# Patient Record
Sex: Female | Born: 1965 | Race: White | Hispanic: No | Marital: Single | Smoking: Never smoker
Health system: Southern US, Community
[De-identification: ages and names within clinical notes are randomized; demographics above are authoritative.]

## PROBLEM LIST (undated history)

## (undated) DIAGNOSIS — D649 Anemia, unspecified: Secondary | ICD-10-CM

## (undated) HISTORY — DX: Anemia, unspecified: D64.9

## (undated) HISTORY — PX: REFRACTIVE SURGERY: SHX103

---

## 2018-03-01 ENCOUNTER — Ambulatory Visit (INDEPENDENT_AMBULATORY_CARE_PROVIDER_SITE_OTHER): Payer: PRIVATE HEALTH INSURANCE | Admitting: Family Medicine

## 2018-03-01 ENCOUNTER — Encounter: Payer: Self-pay | Admitting: Family Medicine

## 2018-03-01 VITALS — BP 118/70 | HR 62 | Ht 68.25 in | Wt 160.5 lb

## 2018-03-01 DIAGNOSIS — Z0000 Encounter for general adult medical examination without abnormal findings: Secondary | ICD-10-CM

## 2018-03-01 DIAGNOSIS — Z1211 Encounter for screening for malignant neoplasm of colon: Secondary | ICD-10-CM | POA: Insufficient documentation

## 2018-03-01 LAB — URINALYSIS, ROUTINE W REFLEX MICROSCOPIC
Bilirubin Urine: NEGATIVE
Hgb urine dipstick: NEGATIVE
Ketones, ur: NEGATIVE
Leukocytes, UA: NEGATIVE
Nitrite: NEGATIVE
PH: 6.5 (ref 5.0–8.0)
RBC / HPF: NONE SEEN (ref 0–?)
Specific Gravity, Urine: 1.025 (ref 1.000–1.030)
Total Protein, Urine: NEGATIVE
Urine Glucose: NEGATIVE
Urobilinogen, UA: 0.2 (ref 0.0–1.0)

## 2018-03-01 LAB — COMPREHENSIVE METABOLIC PANEL
ALT: 16 U/L (ref 0–35)
AST: 18 U/L (ref 0–37)
Albumin: 4.2 g/dL (ref 3.5–5.2)
Alkaline Phosphatase: 61 U/L (ref 39–117)
BUN: 14 mg/dL (ref 6–23)
CHLORIDE: 105 meq/L (ref 96–112)
CO2: 30 mEq/L (ref 19–32)
CREATININE: 0.83 mg/dL (ref 0.40–1.20)
Calcium: 9.5 mg/dL (ref 8.4–10.5)
GFR: 76.55 mL/min (ref >60.00)
GLUCOSE: 97 mg/dL (ref 70–99)
Potassium: 4 mEq/L (ref 3.5–5.1)
SODIUM: 142 meq/L (ref 135–145)
TOTAL PROTEIN: 6.6 g/dL (ref 6.0–8.3)
Total Bilirubin: 0.8 mg/dL (ref 0.2–1.2)

## 2018-03-01 LAB — CBC
HCT: 39.8 % (ref 36.0–46.0)
Hemoglobin: 13.1 g/dL (ref 12.0–15.0)
MCHC: 32.9 g/dL (ref 30.0–36.0)
MCV: 94.8 fl (ref 78.0–100.0)
Platelets: 191 10*3/uL (ref 150.0–400.0)
RBC: 4.2 Mil/uL (ref 3.87–5.11)
RDW: 12.8 % (ref 11.5–15.5)
WBC: 4.7 10*3/uL (ref 4.0–10.5)

## 2018-03-01 LAB — LIPID PANEL
Cholesterol: 224 mg/dL — ABNORMAL HIGH (ref 0–200)
HDL: 82.3 mg/dL (ref >39.00)
LDL CALC: 125 mg/dL — AB (ref 0–99)
NONHDL: 141.24
Total CHOL/HDL Ratio: 3
Triglycerides: 82 mg/dL (ref 0.0–149.0)
VLDL: 16.4 mg/dL (ref 0.0–40.0)

## 2018-03-01 LAB — TSH: TSH: 1.71 u[IU]/mL (ref 0.35–4.50)

## 2018-03-01 NOTE — Progress Notes (Signed)
Subjective:  Patient ID: Elizabeth Chung, female    DOB: 07/20/1966  Age: 52 y.o. MRN: [id number]  CC: Establish Care   HPI Abriana Saltos presents for a physical exam.  She recently moved to this area from [facility] for her job.  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  She practices yoga and her Fitbit tracks her steps.  She had a goal of 10,000 steps daily.  She has no chronic illnesses or health concerns that she is aware of.  She is single and has no children.  Her mom passed at age 14 from an MI.  Mom had hypertension.  Dad passed at age 72 from Alzheimer's.  Patient has 2 sisters with thyroid disease.  Patient is never had a colonoscopy.  She is due for Pap smear.  She plans follow-up with her GYN provider.  She is fasting today.  She does not smoke or use illicit drugs.  She rarely drinks alcohol.    History Tiya has no past medical history on file.   She has no past surgical history on file.   Her family history is not on file.She reports that she has never smoked. She has never used smokeless tobacco. Her alcohol and drug histories are not on file.  No outpatient medications prior to visit.   No facility-administered medications prior to visit.     ROS Review of Systems  Constitutional: Negative for chills, fatigue, fever and unexpected weight change.  HENT: Positive for congestion.   Eyes: Negative for photophobia and visual disturbance.  Respiratory: Negative for shortness of breath.   Cardiovascular: Negative.   Gastrointestinal: Negative.   Endocrine: Negative for cold intolerance and heat intolerance.  Genitourinary: Negative.   Musculoskeletal: Negative for gait problem and joint swelling.  Skin: Negative for pallor and rash.  Allergic/Immunologic: Negative for immunocompromised state.  Neurological: Negative for weakness and headaches.  Hematological: Does not bruise/bleed easily.  Psychiatric/Behavioral: Negative.     Objective:  BP 118/70 (BP Location: Left  Arm, Patient Position: Sitting, Cuff Size: Normal)   Pulse 62   Ht 5' 8.25" (1.734 m)   Wt 160 lb 8 oz (72.8 kg)   SpO2 99%   BMI 24.23 kg/m   Physical Exam  Constitutional: She is oriented to person, place, and time. She appears well-developed and well-nourished. No distress.  HENT:  Head: Normocephalic and atraumatic.  Right Ear: External ear normal.  Left Ear: External ear normal.  Mouth/Throat: Oropharynx is clear and moist. No oropharyngeal exudate.  Eyes: Pupils are equal, round, and reactive to light. Conjunctivae are normal. Right eye exhibits no discharge. Left eye exhibits no discharge. No scleral icterus.  Neck: Neck supple. No JVD present. No tracheal deviation present. No thyromegaly present.  Cardiovascular: Normal rate, regular rhythm and normal heart sounds.  Pulmonary/Chest: Effort normal and breath sounds normal. No stridor.  Abdominal: Soft. Bowel sounds are normal. She exhibits no distension. There is no tenderness. There is no rebound and no guarding.  Lymphadenopathy:    She has no cervical adenopathy.  Neurological: She is alert and oriented to person, place, and time.  Skin: Skin is warm and dry. She is not diaphoretic.  Psychiatric: She has a normal mood and affect. Her behavior is normal.      Assessment & Plan:   Noella was seen today for establish care.  Diagnoses and all orders for this visit:  Health care maintenance -     CBC -     Comprehensive  metabolic panel -     HIV antibody -     Lipid panel -     TSH -     Urinalysis, Routine w reflex microscopic  Screen for colon cancer -     Ambulatory referral to Gastroenterology   Roxana HiresMary Hoaglin does not currently have medications on file.  No orders of the defined types were placed in this encounter.    Follow-up: Return Recommended follow up pends results of lab studies.  Mliss SaxWilliam Alfred Fancy Dunkley, MD

## 2018-03-01 NOTE — Patient Instructions (Signed)
Colonoscopy, Adult A colonoscopy is an exam to look at the entire large intestine. During the exam, a lubricated, bendable tube is inserted into the anus and then passed into the rectum, colon, and other parts of the large intestine. A colonoscopy is often done as a part of normal colorectal screening or in response to certain symptoms, such as anemia, persistent diarrhea, abdominal pain, and blood in the stool. The exam can help screen for and diagnose medical problems, including:  Tumors.  Polyps.  Inflammation.  Areas of bleeding.  Tell a health care provider about:  Any allergies you have.  All medicines you are taking, including vitamins, herbs, eye drops, creams, and over-the-counter medicines.  Any problems you or family members have had with anesthetic medicines.  Any blood disorders you have.  Any surgeries you have had.  Any medical conditions you have.  Any problems you have had passing stool. What are the risks? Generally, this is a safe procedure. However, problems may occur, including:  Bleeding.  A tear in the intestine.  A reaction to medicines given during the exam.  Infection (rare).  What happens before the procedure? Eating and drinking restrictions Follow instructions from your health care provider about eating and drinking, which may include:  A few days before the procedure - follow a low-fiber diet. Avoid nuts, seeds, dried fruit, raw fruits, and vegetables.  1-3 days before the procedure - follow a clear liquid diet. Drink only clear liquids, such as clear broth or bouillon, black coffee or tea, clear juice, clear soft drinks or sports drinks, gelatin dessert, and popsicles. Avoid any liquids that contain red or purple dye.  On the day of the procedure - do not eat or drink anything during the 2 hours before the procedure, or within the time period that your health care provider recommends.  Bowel prep If you were prescribed an oral bowel prep  to clean out your colon:  Take it as told by your health care provider. Starting the day before your procedure, you will need to drink a large amount of medicated liquid. The liquid will cause you to have multiple loose stools until your stool is almost clear or light green.  If your skin or anus gets irritated from diarrhea, you may use these to relieve the irritation: ? Medicated wipes, such as adult wet wipes with aloe and vitamin E. ? A skin soothing-product like petroleum jelly.  If you vomit while drinking the bowel prep, take a break for up to 60 minutes and then begin the bowel prep again. If vomiting continues and you cannot take the bowel prep without vomiting, call your health care provider.  General instructions  Ask your health care provider about changing or stopping your regular medicines. This is especially important if you are taking diabetes medicines or blood thinners.  Plan to have someone take you home from the hospital or clinic. What happens during the procedure?  An IV tube may be inserted into one of your veins.  You will be given medicine to help you relax (sedative).  To reduce your risk of infection: ? Your health care team will wash or sanitize their hands. ? Your anal area will be washed with soap.  You will be asked to lie on your side with your knees bent.  Your health care provider will lubricate a long, thin, flexible tube. The tube will have a camera and a light on the end.  The tube will be inserted into your  anus.  The tube will be gently eased through your rectum and colon.  Air will be delivered into your colon to keep it open. You may feel some pressure or cramping.  The camera will be used to take images during the procedure.  A small tissue sample may be removed from your body to be examined under a microscope (biopsy). If any potential problems are found, the tissue will be sent to a lab for testing.  If small polyps are found, your  health care provider may remove them and have them checked for cancer cells.  The tube that was inserted into your anus will be slowly removed. The procedure may vary among health care providers and hospitals. What happens after the procedure?  Your blood pressure, heart rate, breathing rate, and blood oxygen level will be monitored until the medicines you were given have worn off.  Do not drive for 24 hours after the exam.  You may have a small amount of blood in your stool.  You may pass gas and have mild abdominal cramping or bloating due to the air that was used to inflate your colon during the exam.  It is up to you to get the results of your procedure. Ask your health care provider, or the department performing the procedure, when your results will be ready. This information is not intended to replace advice given to you by your health care provider. Make sure you discuss any questions you have with your health care provider. Document Released: 11/20/2000 Document Revised: 09/23/2016 Document Reviewed: 02/04/2016 Elsevier Interactive Patient Education  2018 [place] Screening Colorectal cancer screening is a group of tests used to check for colorectal cancer. Colorectal refers to your colon and rectum. Your colon and rectum are located at the end of your large intestine and carry your bowel movements out of your body. Why is colorectal cancer screening done? It is common for abnormal growths (polyps) to form in the lining of your colon, especially as you get older. These polyps can be cancerous or become cancerous. If colorectal cancer is found at an early stage, it is treatable. Who should be screened for colorectal cancer? Screening is recommended for all adults at average risk starting at age 60. Tests may be recommended every 1 to 10 years. Your health care provider may recommend earlier or more frequent screening if you have:  A history of colorectal  cancer or polyps.  A family member with a history of colorectal cancer or polyps.  Inflammatory bowel disease, such as ulcerative colitis or Crohn disease.  A type of hereditary colon cancer syndrome.  Colorectal cancer symptoms.  Types of screening tests There are several types of colorectal screening tests. They include:  Guaiac-based fecal occult blood testing.  Fecal immunochemical test (FIT).  Stool DNA test.  Barium enema.  Virtual colonoscopy.  Sigmoidoscopy. During this test, a sigmoidoscope is used to examine your rectum and lower colon. A sigmoidoscope is a flexible tube with a camera that is inserted through your anus into your rectum and lower colon.  Colonoscopy. During this test, a colonoscope is used to examine your entire colon. A colonoscope is a long, thin, flexible tube with a camera. This test examines your entire colon and rectum.  This information is not intended to replace advice given to you by your health care provider. Make sure you discuss any questions you have with your health care provider. Document Released: 05/13/2010 Document Revised: 07/02/2016 Document Reviewed: 03/01/2014  Chartered certified accountant Patient Education  2018 [place] Maintenance, Female Adopting a healthy lifestyle and getting preventive care can go a long way to promote health and wellness. Talk with your health care provider about what schedule of regular examinations is right for you. This is a good chance for you to check in with your provider about disease prevention and staying healthy. In between checkups, there are plenty of things you can do on your own. Experts have done a lot of research about which lifestyle changes and preventive measures are most likely to keep you healthy. Ask your health care provider for more information. Weight and diet Eat a healthy diet  Be sure to include plenty of vegetables, fruits, low-fat dairy products, and lean protein.  Do not  eat a lot of foods high in solid fats, added sugars, or salt.  Get regular exercise. This is one of the most important things you can do for your health. ? Most adults should exercise for at least 150 minutes each week. The exercise should increase your heart rate and make you sweat (moderate-intensity exercise). ? Most adults should also do strengthening exercises at least twice a week. This is in addition to the moderate-intensity exercise.  Maintain a healthy weight  Body mass index (BMI) is a measurement that can be used to identify possible weight problems. It estimates body fat based on height and weight. Your health care provider can help determine your BMI and help you achieve or maintain a healthy weight.  For females 43 years of age and older: ? A BMI below 18.5 is considered underweight. ? A BMI of 18.5 to 24.9 is normal. ? A BMI of 25 to 29.9 is considered overweight. ? A BMI of 30 and above is considered obese.  Watch levels of cholesterol and blood lipids  You should start having your blood tested for lipids and cholesterol at 53 years of age, then have this test every 5 years.  You may need to have your cholesterol levels checked more often if: ? Your lipid or cholesterol levels are high. ? You are older than 53 years of age. ? You are at high risk for heart disease.  Cancer screening Lung Cancer  Lung cancer screening is recommended for adults 62-71 years old who are at high risk for lung cancer because of a history of smoking.  A yearly low-dose CT scan of the lungs is recommended for people who: ? Currently smoke. ? Have quit within the past 15 years. ? Have at least a 30-pack-year history of smoking. A pack year is smoking an average of one pack of cigarettes a day for 1 year.  Yearly screening should continue until it has been 15 years since you quit.  Yearly screening should stop if you develop a health problem that would prevent you from having lung cancer  treatment.  Breast Cancer  Practice breast self-awareness. This means understanding how your breasts normally appear and feel.  It also means doing regular breast self-exams. Let your health care provider know about any changes, no matter how small.  If you are in your 20s or 30s, you should have a clinical breast exam (CBE) by a health care provider every 1-3 years as part of a regular health exam.  If you are 67 or older, have a CBE every year. Also consider having a breast X-ray (mammogram) every year.  If you have a family history of breast cancer, talk to your health care provider about  genetic screening.  If you are at high risk for breast cancer, talk to your health care provider about having an MRI and a mammogram every year.  Breast cancer gene (BRCA) assessment is recommended for women who have family members with BRCA-related cancers. BRCA-related cancers include: ? Breast. ? Ovarian. ? Tubal. ? Peritoneal cancers.  Results of the assessment will determine the need for genetic counseling and BRCA1 and BRCA2 testing.  Cervical Cancer Your health care provider may recommend that you be screened regularly for cancer of the pelvic organs (ovaries, uterus, and vagina). This screening involves a pelvic examination, including checking for microscopic changes to the surface of your cervix (Pap test). You may be encouraged to have this screening done every 3 years, beginning at age 30.  For women ages 59-65, health care providers may recommend pelvic exams and Pap testing every 3 years, or they may recommend the Pap and pelvic exam, combined with testing for human papilloma virus (HPV), every 5 years. Some types of HPV increase your risk of cervical cancer. Testing for HPV may also be done on women of any age with unclear Pap test results.  Other health care providers may not recommend any screening for nonpregnant women who are considered low risk for pelvic cancer and who do not have  symptoms. Ask your health care provider if a screening pelvic exam is right for you.  If you have had past treatment for cervical cancer or a condition that could lead to cancer, you need Pap tests and screening for cancer for at least 20 years after your treatment. If Pap tests have been discontinued, your risk factors (such as having a new sexual partner) need to be reassessed to determine if screening should resume. Some women have medical problems that increase the chance of getting cervical cancer. In these cases, your health care provider may recommend more frequent screening and Pap tests.  Colorectal Cancer  This type of cancer can be detected and often prevented.  Routine colorectal cancer screening usually begins at 53 years of age and continues through 53 years of age.  Your health care provider may recommend screening at an earlier age if you have risk factors for colon cancer.  Your health care provider may also recommend using home test kits to check for hidden blood in the stool.  A small camera at the end of a tube can be used to examine your colon directly (sigmoidoscopy or colonoscopy). This is done to check for the earliest forms of colorectal cancer.  Routine screening usually begins at age 89.  Direct examination of the colon should be repeated every 5-10 years through 53 years of age. However, you may need to be screened more often if early forms of precancerous polyps or small growths are found.  Skin Cancer  Check your skin from head to toe regularly.  Tell your health care provider about any new moles or changes in moles, especially if there is a change in a mole's shape or color.  Also tell your health care provider if you have a mole that is larger than the size of a pencil eraser.  Always use sunscreen. Apply sunscreen liberally and repeatedly throughout the day.  Protect yourself by wearing long sleeves, pants, a wide-brimmed hat, and sunglasses whenever you  are outside.  Heart disease, diabetes, and high blood pressure  High blood pressure causes heart disease and increases the risk of stroke. High blood pressure is more likely to develop in: ? People  who have blood pressure in the high end of the normal range (130-139/85-89 mm Hg). ? People who are overweight or obese. ? People who are African American.  If you are 45-21 years of age, have your blood pressure checked every 3-5 years. If you are 23 years of age or older, have your blood pressure checked every year. You should have your blood pressure measured twice-once when you are at a hospital or clinic, and once when you are not at a hospital or clinic. Record the average of the two measurements. To check your blood pressure when you are not at a hospital or clinic, you can use: ? An automated blood pressure machine at a pharmacy. ? A home blood pressure monitor.  If you are between 50 years and 68 years old, ask your health care provider if you should take aspirin to prevent strokes.  Have regular diabetes screenings. This involves taking a blood sample to check your fasting blood sugar level. ? If you are at a normal weight and have a low risk for diabetes, have this test once every three years after 53 years of age. ? If you are overweight and have a high risk for diabetes, consider being tested at a younger age or more often. Preventing infection Hepatitis B  If you have a higher risk for hepatitis B, you should be screened for this virus. You are considered at high risk for hepatitis B if: ? You were born in a country where hepatitis B is common. Ask your health care provider which countries are considered high risk. ? Your parents were born in a high-risk country, and you have not been immunized against hepatitis B (hepatitis B vaccine). ? You have HIV or AIDS. ? You use needles to inject street drugs. ? You live with someone who has hepatitis B. ? You have had sex with someone who  has hepatitis B. ? You get hemodialysis treatment. ? You take certain medicines for conditions, including cancer, organ transplantation, and autoimmune conditions.  Hepatitis C  Blood testing is recommended for: ? Everyone born from [birth year] through 1966. ? Anyone with known risk factors for hepatitis C.  Sexually transmitted infections (STIs)  You should be screened for sexually transmitted infections (STIs) including gonorrhea and chlamydia if: ? You are sexually active and are younger than 53 years of age. ? You are older than 53 years of age and your health care provider tells you that you are at risk for this type of infection. ? Your sexual activity has changed since you were last screened and you are at an increased risk for chlamydia or gonorrhea. Ask your health care provider if you are at risk.  If you do not have HIV, but are at risk, it may be recommended that you take a prescription medicine daily to prevent HIV infection. This is called pre-exposure prophylaxis (PrEP). You are considered at risk if: ? You are sexually active and do not regularly use condoms or know the HIV status of your partner(s). ? You take drugs by injection. ? You are sexually active with a partner who has HIV.  Talk with your health care provider about whether you are at high risk of being infected with HIV. If you choose to begin PrEP, you should first be tested for HIV. You should then be tested every 3 months for as long as you are taking PrEP. Pregnancy  If you are premenopausal and you may become pregnant, ask your health care provider  about preconception counseling.  If you may become pregnant, take 400 to 800 micrograms (mcg) of folic acid every day.  If you want to prevent pregnancy, talk to your health care provider about birth control (contraception). Osteoporosis and menopause  Osteoporosis is a disease in which the bones lose minerals and strength with aging. This can result in serious bone  fractures. Your risk for osteoporosis can be identified using a bone density scan.  If you are 34 years of age or older, or if you are at risk for osteoporosis and fractures, ask your health care provider if you should be screened.  Ask your health care provider whether you should take a calcium or vitamin D supplement to lower your risk for osteoporosis.  Menopause may have certain physical symptoms and risks.  Hormone replacement therapy may reduce some of these symptoms and risks. Talk to your health care provider about whether hormone replacement therapy is right for you. Follow these instructions at home:  Schedule regular health, dental, and eye exams.  Stay current with your immunizations.  Do not use any tobacco products including cigarettes, chewing tobacco, or electronic cigarettes.  If you are pregnant, do not drink alcohol.  If you are breastfeeding, limit how much and how often you drink alcohol.  Limit alcohol intake to no more than 1 drink per day for nonpregnant women. One drink equals 12 ounces of beer, 5 ounces of wine, or 1 ounces of hard liquor.  Do not use street drugs.  Do not share needles.  Ask your health care provider for help if you need support or information about quitting drugs.  Tell your health care provider if you often feel depressed.  Tell your health care provider if you have ever been abused or do not feel safe at home. This information is not intended to replace advice given to you by your health care provider. Make sure you discuss any questions you have with your health care provider. Document Released: 06/08/2011 Document Revised: 04/30/2016 Document Reviewed: 08/27/2015 Elsevier Interactive Patient Education  2018 [place] Years, Female Preventive care refers to lifestyle choices and visits with your health care provider that can promote health and wellness. What does preventive care include?  A yearly  physical exam. This is also called an annual well check.  Dental exams once or twice a year.  Routine eye exams. Ask your health care provider how often you should have your eyes checked.  Personal lifestyle choices, including: ? Daily care of your teeth and gums. ? Regular physical activity. ? Eating a healthy diet. ? Avoiding tobacco and drug use. ? Limiting alcohol use. ? Practicing safe sex. ? Taking low-dose aspirin daily starting at age 56. ? Taking vitamin and mineral supplements as recommended by your health care provider. What happens during an annual well check? The services and screenings done by your health care provider during your annual well check will depend on your age, overall health, lifestyle risk factors, and family history of disease. Counseling Your health care provider may ask you questions about your:  Alcohol use.  Tobacco use.  Drug use.  Emotional well-being.  Home and relationship well-being.  Sexual activity.  Eating habits.  Work and work Statistician.  Method of birth control.  Menstrual cycle.  Pregnancy history.  Screening You may have the following tests or measurements:  Height, weight, and BMI.  Blood pressure.  Lipid and cholesterol levels. These may be checked every 5 years, or  more frequently if you are over 7 years old.  Skin check.  Lung cancer screening. You may have this screening every year starting at age 35 if you have a 30-pack-year history of smoking and currently smoke or have quit within the past 15 years.  Fecal occult blood test (FOBT) of the stool. You may have this test every year starting at age 53.  Flexible sigmoidoscopy or colonoscopy. You may have a sigmoidoscopy every 5 years or a colonoscopy every 10 years starting at age 104.  Hepatitis C blood test.  Hepatitis B blood test.  Sexually transmitted disease (STD) testing.  Diabetes screening. This is done by checking your blood sugar (glucose)  after you have not eaten for a while (fasting). You may have this done every 1-3 years.  Mammogram. This may be done every 1-2 years. Talk to your health care provider about when you should start having regular mammograms. This may depend on whether you have a family history of breast cancer.  BRCA-related cancer screening. This may be done if you have a family history of breast, ovarian, tubal, or peritoneal cancers.  Pelvic exam and Pap test. This may be done every 3 years starting at age 82. Starting at age 30, this may be done every 5 years if you have a Pap test in combination with an HPV test.  Bone density scan. This is done to screen for osteoporosis. You may have this scan if you are at high risk for osteoporosis.  Discuss your test results, treatment options, and if necessary, the need for more tests with your health care provider. Vaccines Your health care provider may recommend certain vaccines, such as:  Influenza vaccine. This is recommended every year.  Tetanus, diphtheria, and acellular pertussis (Tdap, Td) vaccine. You may need a Td booster every 10 years.  Varicella vaccine. You may need this if you have not been vaccinated.  Zoster vaccine. You may need this after age 59.  Measles, mumps, and rubella (MMR) vaccine. You may need at least one dose of MMR if you were born in 1957 or later. You may also need a second dose.  Pneumococcal 13-valent conjugate (PCV13) vaccine. You may need this if you have certain conditions and were not previously vaccinated.  Pneumococcal polysaccharide (PPSV23) vaccine. You may need one or two doses if you smoke cigarettes or if you have certain conditions.  Meningococcal vaccine. You may need this if you have certain conditions.  Hepatitis A vaccine. You may need this if you have certain conditions or if you travel or work in places where you may be exposed to hepatitis A.  Hepatitis B vaccine. You may need this if you have certain  conditions or if you travel or work in places where you may be exposed to hepatitis B.  Haemophilus influenzae type b (Hib) vaccine. You may need this if you have certain conditions.  Talk to your health care provider about which screenings and vaccines you need and how often you need them. This information is not intended to replace advice given to you by your health care provider. Make sure you discuss any questions you have with your health care provider. Document Released: 12/20/2015 Document Revised: 08/12/2016 Document Reviewed: 09/24/2015 Elsevier Interactive Patient Education  [employer].

## 2018-03-02 LAB — HIV ANTIBODY (ROUTINE TESTING W REFLEX): HIV: NONREACTIVE

## 2018-04-01 ENCOUNTER — Encounter: Payer: Self-pay | Admitting: Family Medicine

## 2019-06-14 ENCOUNTER — Telehealth: Payer: Self-pay

## 2019-06-14 NOTE — Telephone Encounter (Signed)
Copied from [place] [phone]. Topic: Appointment Scheduling - Transfer of Care >> Jun 14, 2019 10:47 AM Alanda Slim E wrote: Pt is requesting to transfer FROM: Dr. Ethelene Hal Pt is requesting to transfer TO: [place] Reason for requested transfer: Pt has moved to a different area   Please call Pt when the transfer is approved / Cb# [phone]  Send CRM to patient's current PCP (transferring FROM).

## 2019-06-15 NOTE — Telephone Encounter (Signed)
Pt requesting to transfer care to you. Please advise. Thanks!

## 2019-06-15 NOTE — Telephone Encounter (Signed)
Okay 

## 2019-06-15 NOTE — Telephone Encounter (Signed)
Pt scheduled for 07/10/19 for TOC.

## 2019-06-15 NOTE — Telephone Encounter (Signed)
ok 

## 2019-07-10 ENCOUNTER — Other Ambulatory Visit: Payer: Self-pay

## 2019-07-10 ENCOUNTER — Encounter: Payer: Self-pay | Admitting: Family Medicine

## 2019-07-10 ENCOUNTER — Telehealth: Payer: Self-pay | Admitting: *Deleted

## 2019-07-10 ENCOUNTER — Ambulatory Visit (INDEPENDENT_AMBULATORY_CARE_PROVIDER_SITE_OTHER): Payer: PRIVATE HEALTH INSURANCE | Admitting: Family Medicine

## 2019-07-10 VITALS — Wt 167.0 lb

## 2019-07-10 DIAGNOSIS — M79644 Pain in right finger(s): Secondary | ICD-10-CM | POA: Diagnosis not present

## 2019-07-10 DIAGNOSIS — Z1211 Encounter for screening for malignant neoplasm of colon: Secondary | ICD-10-CM

## 2019-07-10 DIAGNOSIS — Z1239 Encounter for other screening for malignant neoplasm of breast: Secondary | ICD-10-CM

## 2019-07-10 DIAGNOSIS — J302 Other seasonal allergic rhinitis: Secondary | ICD-10-CM | POA: Insufficient documentation

## 2019-07-10 NOTE — Telephone Encounter (Signed)
Doxy re-sent to email address.

## 2019-07-10 NOTE — Telephone Encounter (Signed)
Copied from [place] [phone]. Topic: Appointment Scheduling - Scheduling Inquiry for Clinic >> Jul 10, 2019 10:12 AM Richardo Priest, NT wrote: Reason for CRM: Patient called in stating link has not been sent. Attempted to contact FC line 3x. No answer. Patient appointment at 10:15.

## 2019-07-10 NOTE — Progress Notes (Signed)
Virtual Visit via Video Note  I connected with Elizabeth Chung   on 07/10/19 at 10:15 AM EDT by a video enabled telemedicine application and verified that I am speaking with the correct person using two identifiers.  Location patient: home Location provider:work office Persons participating in the virtual visit: patient, provider  I discussed the limitations of evaluation and management by telemedicine and the availability of in person appointments. The patient expressed understanding and agreed to proceed.   Elizabeth Chung DOB: 06-Apr-1966 Encounter date: 07/10/2019  This is a 53 y.o. female who presents to establish care. Chief Complaint  Patient presents with  . Establish Care    History of present illness: Called originally because having a lot of congestion, but went to urgent care and told that she had eustachian tube dysfunction - flonase, zyrtec D. Always has sinus issues. Doesn't regularly take medication. Does well with neti pot. Was dizzy as well but this is also better. Medication made her dizzy too. Better on just zyrtec D.   Anemia history: hasn't given blood in long time, but would be told she was anemic in past. Was put on iron for awhile when younger.   Went through menopause around [age]. Not seeing gynecologist.   Had mammogram last in 2016.  Tries to exercise 3-5 times/week. Does classical stretch program - combo yoga, barre.   Had shooting pain between knuckle/base of thumb a couple of weeks ago. Was intense but now gets it just intermittent. No known injury. Feels a little tight in that area.   Past Medical History:  Diagnosis Date  . Anemia    Past Surgical History:  Procedure Laterality Date  . REFRACTIVE SURGERY     No Known Allergies No outpatient medications have been marked as taking for the 07/10/19 encounter (Office Visit) with Caren Macadam, MD.   Social History   Tobacco Use  . Smoking status: Never Smoker  . Smokeless tobacco: Never Used   Substance Use Topics  . Alcohol use: Not on file    Comment: occasional liquor socially   Family History  Problem Relation Age of Onset  . Heart attack Mother 66  . High blood pressure Mother   . Thyroid cancer Mother   . High Cholesterol Mother   . Alzheimer's disease Father 62       age of death 52  . Charcot-Marie-Tooth disease Father   . Thyroid disease Sister   . Charcot-Marie-Tooth disease Sister   . Thyroid disease Sister        hashimoto     Review of Systems  Constitutional: Negative for chills, fatigue and fever.  Respiratory: Negative for cough, chest tightness, shortness of breath and wheezing.   Cardiovascular: Negative for chest pain, palpitations and leg swelling.    Objective:  Wt 167 lb (75.8 kg) Comment: per patient  BMI 25.21 kg/m   Weight: 167 lb (75.8 kg)(per patient)   BP Readings from Last 3 Encounters:  03/01/18 118/70   Wt Readings from Last 3 Encounters:  07/10/19 167 lb (75.8 kg)  03/01/18 160 lb 8 oz (72.8 kg)    EXAM:  GENERAL: alert, oriented, appears well and in no acute distress  HEENT: atraumatic, conjunctiva clear, no obvious abnormalities on inspection of external nose and ears  NECK: normal movements of the head and neck  LUNGS: on inspection no signs of respiratory distress, breathing rate appears normal, no obvious gross SOB, gasping or wheezing  CV: no obvious cyanosis  MS: moves all  visible extremities without noticeable abnormality  PSYCH/NEURO: pleasant and cooperative, no obvious depression or anxiety, speech and thought processing grossly intact  SKIN: no abnormalities face/neck.   Assessment/Plan  1. Screening for colon cancer cologuard ordered today. She lives alone and doesn't have ride to and from colonoscopy so we will start with this.   2. Pain of right thumb Will let me know if any worsening of this or can bring in office to examine.   3. Screening for breast cancer Mammogram ordered today.    Return for physical exam.    I discussed the assessment and treatment plan with the patient. The patient was provided an opportunity to ask questions and all were answered. The patient agreed with the plan and demonstrated an understanding of the instructions.   The patient was advised to call back or seek an in-person evaluation if the symptoms worsen or if the condition fails to improve as anticipated.  I provided 28 minutes of non-face-to-face time during this encounter.   Theodis ShoveJunell [place], MD

## 2019-07-11 ENCOUNTER — Telehealth: Payer: Self-pay | Admitting: *Deleted

## 2019-07-11 NOTE — Telephone Encounter (Signed)
-----   Message from Caren Macadam, MD sent at 07/10/2019 11:14 AM EDT ----- Please help her schedule physical in fall. Thanks!

## 2019-07-11 NOTE — Telephone Encounter (Signed)
I left a detailed message for the pt to call to schedule an appt as below.

## 2019-09-29 ENCOUNTER — Ambulatory Visit
Admission: RE | Admit: 2019-09-29 | Discharge: 2019-09-29 | Disposition: A | Discharge status: Home / Self Care | Payer: No Typology Code available for payment source | Source: Ambulatory Visit | Attending: Family Medicine | Admitting: Family Medicine

## 2019-09-29 ENCOUNTER — Other Ambulatory Visit: Payer: Self-pay

## 2019-09-29 DIAGNOSIS — Z1239 Encounter for other screening for malignant neoplasm of breast: Secondary | ICD-10-CM

## 2019-12-19 ENCOUNTER — Telehealth: Payer: Self-pay | Admitting: *Deleted

## 2019-12-19 NOTE — Telephone Encounter (Signed)
I left a detailed message at the pts home number with the information below and to call back if the kit has not been received.    

## 2019-12-19 NOTE — Telephone Encounter (Signed)
-----   Message from Wynn Banker, MD sent at 12/16/2019 12:11 PM EST ----- Was cologuard received? I don't see results; just checking in. Please resend order if she did not receive.  ----- Message ----- From: SYSTEM Sent: 07/15/2019  12:04 AM EST To: Wynn Banker, MD

## 2020-01-26 NOTE — Telephone Encounter (Signed)
Pt received a vm a couple weeks ago asking if she received the cologaurd and wants to make sure this was not ordered b/c she does not want to go that route.   Pt can be reached at [phone]

## 2020-01-26 NOTE — Telephone Encounter (Signed)
Fine if she wants to do colonoscopy instead? We had discussed and she had agreed to cologuard because of issues with transportation to/from procedure, but please help facilitate her preferred screening method.Marland Kitchen

## 2020-01-29 NOTE — Telephone Encounter (Signed)
Spoke with the pt and informed her of the message below.  Patient stated she does not want to do the Cologuard testing and does not have anyone to drive her to the appt for a colonoscopy and will call back if she wants to schedule this.

## 2020-02-29 ENCOUNTER — Other Ambulatory Visit: Payer: Self-pay

## 2020-03-01 ENCOUNTER — Encounter: Payer: Self-pay | Admitting: Family Medicine

## 2020-03-01 ENCOUNTER — Ambulatory Visit (INDEPENDENT_AMBULATORY_CARE_PROVIDER_SITE_OTHER): Payer: No Typology Code available for payment source | Admitting: Family Medicine

## 2020-03-01 ENCOUNTER — Ambulatory Visit (INDEPENDENT_AMBULATORY_CARE_PROVIDER_SITE_OTHER): Payer: No Typology Code available for payment source

## 2020-03-01 VITALS — BP 98/72 | HR 88 | Temp 97.9°F | Ht 68.25 in | Wt 172.7 lb

## 2020-03-01 DIAGNOSIS — M79644 Pain in right finger(s): Secondary | ICD-10-CM

## 2020-03-01 NOTE — Patient Instructions (Signed)
I will let you know about xray results when I get them. Follow up pending these.   Call insurance to see about coverage for cologuard versus colonoscopy. (ideally they cover the cologuard for screening and would also cover the colonoscopy for follow up if the cologuard is positive (a diagnostic colonoscopy)).

## 2020-03-01 NOTE — Progress Notes (Signed)
  Elizabeth Chung DOB: [birth year] Encounter date: 03/01/2020  This is a 54 y.o. female who presents with Chief Complaint  Patient presents with  . Hand Pain    patient complains of intermittent right thumb pain xmonths, no known injury    History of present illness: Pain in right thumb - was really severe at time of making appointment. Was also having shoulder pain at that time, but that has improved. Was limited with shoulder movement.   Onset of thumb pain was last year and was shooting. Comes and goes. Not sure what triggers it to worsen. Worsened when she hit thumb on milk carton and was so painful that she dropped milk carton. Does not feel lump, swelling but feels tight to her.   No numbness, tingling, weakness in other fingers.    No neck pain except intermittent from sleeping.     No Known Allergies No outpatient medications have been marked as taking for the 03/01/20 encounter (Office Visit) with Wynn Banker, MD.    Review of Systems  Constitutional: Negative for chills, fatigue and fever.  Respiratory: Negative for cough, chest tightness, shortness of breath and wheezing.   Cardiovascular: Negative for chest pain, palpitations and leg swelling.  Neurological: Positive for numbness.    Objective:  BP 98/72 (BP Location: Left Arm, Patient Position: Sitting, Cuff Size: Normal)   Pulse 88   Temp 97.9 F (36.6 C) (Temporal)   Ht 5' 8.25" (1.734 m)   Wt 172 lb 11.2 oz (78.3 kg)   BMI 26.07 kg/m   Weight: 172 lb 11.2 oz (78.3 kg)   BP Readings from Last 3 Encounters:  03/01/20 98/72  03/01/18 118/70   Wt Readings from Last 3 Encounters:  03/01/20 172 lb 11.2 oz (78.3 kg)  07/10/19 167 lb (75.8 kg)  03/01/18 160 lb 8 oz (72.8 kg)    Physical Exam Constitutional:      General: She is not in acute distress.    Appearance: She is well-developed.  Cardiovascular:     Rate and Rhythm: Normal rate and regular rhythm.     Heart sounds: Normal heart sounds. No  murmur. No friction rub.  Pulmonary:     Effort: Pulmonary effort is normal. No respiratory distress.     Breath sounds: Normal breath sounds. No wheezing or rales.  Musculoskeletal:     Right lower leg: No edema.     Left lower leg: No edema.     Comments: Palpable cyst palmar aspect right thumb at IP joint approx 30mm. This is tender.  Neurological:     Mental Status: She is alert and oriented to person, place, and time.     Cranial Nerves: No cranial nerve deficit.     Motor: No weakness.     Deep Tendon Reflexes:     Reflex Scores:      Bicep reflexes are 2+ on the right side and 2+ on the left side.      Brachioradialis reflexes are 2+ on the right side and 2+ on the left side. Psychiatric:        Behavior: Behavior normal.     Assessment/Plan  1. Thumb pain, right Discussed tx options with cyst - will get xray to make sure cystic and not calcification. Can see hand surgery, can monitor, or we can try to drain.  - DG Hand Complete Right; Future    Return for pending xray.     Theodis Shove, MD

## 2020-04-23 ENCOUNTER — Other Ambulatory Visit: Payer: Self-pay

## 2020-04-24 ENCOUNTER — Ambulatory Visit (INDEPENDENT_AMBULATORY_CARE_PROVIDER_SITE_OTHER): Payer: No Typology Code available for payment source

## 2020-04-24 ENCOUNTER — Encounter: Payer: Self-pay | Admitting: Family Medicine

## 2020-04-24 ENCOUNTER — Ambulatory Visit (INDEPENDENT_AMBULATORY_CARE_PROVIDER_SITE_OTHER): Payer: No Typology Code available for payment source | Admitting: Family Medicine

## 2020-04-24 VITALS — BP 100/80 | HR 70 | Temp 97.6°F | Ht 68.25 in | Wt 169.2 lb

## 2020-04-24 DIAGNOSIS — M79672 Pain in left foot: Secondary | ICD-10-CM

## 2020-04-24 DIAGNOSIS — R936 Abnormal findings on diagnostic imaging of limbs: Secondary | ICD-10-CM

## 2020-04-24 NOTE — Progress Notes (Signed)
  Elizabeth Chung DOB: [birth year] Encounter date: 04/24/2020  This is a 54 y.o. female who presents with No chief complaint on file.   History of present illness: Thumb is feeling a little better, but also wanted me to look at foot.   Top of foot swelled up on vacation. Old injury with filing cabinet in that location. Now swells intermittently. Doesn't hurt itself, but sometimes burning/radiating pain in that area. About a year ago; hit top of foot with filing cabinet.   No Known Allergies No outpatient medications have been marked as taking for the 04/24/20 encounter (Appointment) with Wynn Banker, MD.    Review of Systems  Constitutional: Negative for chills, fatigue and fever.  Eyes: Negative for itching.  Respiratory: Negative for cough, chest tightness, shortness of breath and wheezing.   Cardiovascular: Negative for chest pain, palpitations and leg swelling.  Musculoskeletal:       Foot pain     Objective:  There were no vitals taken for this visit.      BP Readings from Last 3 Encounters:  03/01/20 98/72  03/01/18 118/70   Wt Readings from Last 3 Encounters:  03/01/20 172 lb 11.2 oz (78.3 kg)  07/10/19 167 lb (75.8 kg)  03/01/18 160 lb 8 oz (72.8 kg)    Physical Exam Constitutional:      General: She is not in acute distress.    Appearance: She is well-developed.  Cardiovascular:     Rate and Rhythm: Normal rate and regular rhythm.     Heart sounds: Normal heart sounds. No murmur. No friction rub.  Pulmonary:     Effort: Pulmonary effort is normal. No respiratory distress.     Breath sounds: Normal breath sounds. No wheezing or rales.  Musculoskeletal:     Right lower leg: No edema.     Left lower leg: No edema.     Comments: There is tenderness to palpation of 3rd/4rth metatarsals and there is edema of dorsum of foot.   Small 0.25cm cyst right thumb, nontender  Neurological:     Mental Status: She is alert and oriented to person, place, and time.   Psychiatric:        Behavior: Behavior normal.     Assessment/Plan  1. Left foot pain Xray today; follow up pending this result - DG Foot Complete Left; Future  Ganglion cyst in hand has improved. It is very small now and not tender; we will not drain it today.   Return for pending xray result.  Theodis Shove, MD

## 2020-05-01 ENCOUNTER — Telehealth: Payer: Self-pay | Admitting: Family Medicine

## 2020-05-01 NOTE — Telephone Encounter (Signed)
error 

## 2020-05-02 ENCOUNTER — Other Ambulatory Visit: Payer: Self-pay

## 2020-05-03 ENCOUNTER — Ambulatory Visit (INDEPENDENT_AMBULATORY_CARE_PROVIDER_SITE_OTHER): Payer: No Typology Code available for payment source | Admitting: Family Medicine

## 2020-05-03 ENCOUNTER — Encounter: Payer: Self-pay | Admitting: Family Medicine

## 2020-05-03 VITALS — BP 100/78 | HR 66 | Temp 97.6°F | Ht 68.5 in | Wt 169.8 lb

## 2020-05-03 DIAGNOSIS — Z23 Encounter for immunization: Secondary | ICD-10-CM

## 2020-05-03 DIAGNOSIS — Z0000 Encounter for general adult medical examination without abnormal findings: Secondary | ICD-10-CM | POA: Diagnosis not present

## 2020-05-03 DIAGNOSIS — R5383 Other fatigue: Secondary | ICD-10-CM | POA: Diagnosis not present

## 2020-05-03 DIAGNOSIS — Z131 Encounter for screening for diabetes mellitus: Secondary | ICD-10-CM

## 2020-05-03 DIAGNOSIS — Z1211 Encounter for screening for malignant neoplasm of colon: Secondary | ICD-10-CM | POA: Diagnosis not present

## 2020-05-03 DIAGNOSIS — Z13220 Encounter for screening for lipoid disorders: Secondary | ICD-10-CM

## 2020-05-03 LAB — COMPREHENSIVE METABOLIC PANEL
ALT: 14 U/L (ref 0–35)
AST: 18 U/L (ref 0–37)
Albumin: 4.4 g/dL (ref 3.5–5.2)
Alkaline Phosphatase: 71 U/L (ref 39–117)
BUN: 12 mg/dL (ref 6–23)
CO2: 28 mEq/L (ref 19–32)
Calcium: 9.3 mg/dL (ref 8.4–10.5)
Chloride: 104 mEq/L (ref 96–112)
Creatinine, Ser: 0.94 mg/dL (ref 0.40–1.20)
GFR: 61.88 mL/min (ref >60.00)
Glucose, Bld: 92 mg/dL (ref 70–99)
Potassium: 4.1 mEq/L (ref 3.5–5.1)
Sodium: 140 mEq/L (ref 135–145)
Total Bilirubin: 0.7 mg/dL (ref 0.2–1.2)
Total Protein: 6.6 g/dL (ref 6.0–8.3)

## 2020-05-03 LAB — LIPID PANEL
Cholesterol: 230 mg/dL — ABNORMAL HIGH (ref 0–200)
HDL: 70.6 mg/dL (ref >39.00)
LDL Cholesterol: 139 mg/dL — ABNORMAL HIGH (ref 0–99)
NonHDL: 159.28
Total CHOL/HDL Ratio: 3
Triglycerides: 103 mg/dL (ref 0.0–149.0)
VLDL: 20.6 mg/dL (ref 0.0–40.0)

## 2020-05-03 LAB — CBC WITH DIFFERENTIAL/PLATELET
Basophils Absolute: 0 10*3/uL (ref 0.0–0.1)
Basophils Relative: 0.9 % (ref 0.0–3.0)
Eosinophils Absolute: 0.1 10*3/uL (ref 0.0–0.7)
Eosinophils Relative: 2 % (ref 0.0–5.0)
HCT: 38.9 % (ref 36.0–46.0)
Hemoglobin: 13.1 g/dL (ref 12.0–15.0)
Lymphocytes Relative: 26.5 % (ref 12.0–46.0)
Lymphs Abs: 1.3 10*3/uL (ref 0.7–4.0)
MCHC: 33.6 g/dL (ref 30.0–36.0)
MCV: 92.7 fl (ref 78.0–100.0)
Monocytes Absolute: 0.3 10*3/uL (ref 0.1–1.0)
Monocytes Relative: 6.4 % (ref 3.0–12.0)
Neutro Abs: 3.2 10*3/uL (ref 1.4–7.7)
Neutrophils Relative %: 64.2 % (ref 43.0–77.0)
Platelets: 196 10*3/uL (ref 150.0–400.0)
RBC: 4.2 Mil/uL (ref 3.87–5.11)
RDW: 12.4 % (ref 11.5–15.5)
WBC: 5 10*3/uL (ref 4.0–10.5)

## 2020-05-03 LAB — VITAMIN D 25 HYDROXY (VIT D DEFICIENCY, FRACTURES): VITD: 44.67 ng/mL (ref 30.00–100.00)

## 2020-05-03 LAB — VITAMIN B12: Vitamin B-12: 451 pg/mL (ref 211–911)

## 2020-05-03 LAB — TSH: TSH: 2.25 u[IU]/mL (ref 0.35–4.50)

## 2020-05-03 NOTE — Progress Notes (Signed)
Elizabeth Chung DOB: 1966/04/15 Encounter date: 05/03/2020  This is a 54 y.o. female who presents for complete physical   History of present illness/Additional concerns:  Last mammogram was October/2020 and was normal  Cologuard ordered in August/2020 and not completed yet; she was debating about completing that and now switching insurance.   No Pap smear on file; patient declines today. Had it down in [place] thinks around 2016; no history of abnormal.   Energy level hasn't been quite as good with COVID. Doing more sitting. Not sleeping great, but never has been. Always trouble with falling asleep. Trouble turning off brain.   Has been trying to eat more salad in last couple of months. Has been working on this. She has hard time controlling sweets.   Does have history of anemia.   Periods stopped around age 79. No spotting since one year after that.    Past Medical History:  Diagnosis Date  . Anemia    Past Surgical History:  Procedure Laterality Date  . REFRACTIVE SURGERY     No Known Allergies No outpatient medications have been marked as taking for the 05/03/20 encounter (Office Visit) with Wynn Banker, MD.   Social History   Tobacco Use  . Smoking status: Never Smoker  . Smokeless tobacco: Never Used  Substance Use Topics  . Alcohol use: Not on file    Comment: occasional liquor socially   Family History  Problem Relation Age of Onset  . Heart attack Mother 47  . High blood pressure Mother   . Thyroid cancer Mother   . High Cholesterol Mother   . Alzheimer's disease Father 7       age of death 33  . Charcot-Marie-Tooth disease Father   . Thyroid disease Sister   . Charcot-Marie-Tooth disease Sister   . Thyroid disease Sister        hashimoto  . Breast cancer Maternal Grandmother   . Lung cancer Paternal Grandmother   . Aortic aneurysm Paternal Grandfather        smoker     Review of Systems  Constitutional: Negative for activity change, appetite  change, chills, fatigue, fever and unexpected weight change.  HENT: Negative for congestion, ear pain, hearing loss, sinus pressure, sinus pain, sore throat and trouble swallowing.   Eyes: Negative for pain and visual disturbance.  Respiratory: Negative for cough, chest tightness, shortness of breath and wheezing.   Cardiovascular: Negative for chest pain, palpitations and leg swelling.  Gastrointestinal: Negative for abdominal pain, blood in stool, constipation, diarrhea, nausea and vomiting.  Genitourinary: Negative for difficulty urinating and menstrual problem.  Musculoskeletal: Negative for arthralgias and back pain.  Skin: Negative for rash.  Neurological: Negative for dizziness, weakness, numbness and headaches.  Hematological: Negative for adenopathy. Does not bruise/bleed easily.  Psychiatric/Behavioral: Negative for sleep disturbance and suicidal ideas. The patient is not nervous/anxious.     CBC:  Lab Results  Component Value Date   WBC 4.7 03/01/2018   HGB 13.1 03/01/2018   HCT 39.8 03/01/2018   MCHC 32.9 03/01/2018   RDW 12.8 03/01/2018   PLT 191.0 03/01/2018   CMP: Lab Results  Component Value Date   NA 142 03/01/2018   K 4.0 03/01/2018   CL 105 03/01/2018   CO2 30 03/01/2018   GLUCOSE 97 03/01/2018   BUN 14 03/01/2018   CREATININE 0.83 03/01/2018   CALCIUM 9.5 03/01/2018   PROT 6.6 03/01/2018   BILITOT 0.8 03/01/2018   ALKPHOS 61 03/01/2018  ALT 16 03/01/2018   AST 18 03/01/2018   LIPID: Lab Results  Component Value Date   CHOL 224 (H) 03/01/2018   TRIG 82.0 03/01/2018   HDL 82.30 03/01/2018   LDLCALC 125 (H) 03/01/2018    Objective:  BP 100/78 (BP Location: Left Arm, Patient Position: Sitting, Cuff Size: Large)   Pulse 66   Temp 97.6 F (36.4 C) (Temporal)   Ht 5' 8.5" (1.74 m)   Wt 169 lb 12.8 oz (77 kg)   BMI 25.44 kg/m   Weight: 169 lb 12.8 oz (77 kg)   BP Readings from Last 3 Encounters:  05/03/20 100/78  04/24/20 100/80  03/01/20  98/72   Wt Readings from Last 3 Encounters:  05/03/20 169 lb 12.8 oz (77 kg)  04/24/20 169 lb 3.2 oz (76.7 kg)  03/01/20 172 lb 11.2 oz (78.3 kg)    Physical Exam Constitutional:      General: She is not in acute distress.    Appearance: She is well-developed.  HENT:     Head: Normocephalic and atraumatic.     Right Ear: External ear normal.     Left Ear: External ear normal.     Mouth/Throat:     Pharynx: No oropharyngeal exudate.  Eyes:     Conjunctiva/sclera: Conjunctivae normal.     Pupils: Pupils are equal, round, and reactive to light.  Neck:     Thyroid: No thyromegaly.  Cardiovascular:     Rate and Rhythm: Normal rate and regular rhythm.     Heart sounds: Normal heart sounds. No murmur. No friction rub. No gallop.   Pulmonary:     Effort: Pulmonary effort is normal.     Breath sounds: Normal breath sounds.  Abdominal:     General: Bowel sounds are normal. There is no distension.     Palpations: Abdomen is soft. There is no mass.     Tenderness: There is no abdominal tenderness. There is no guarding.     Hernia: No hernia is present.  Musculoskeletal:        General: No tenderness or deformity. Normal range of motion.     Cervical back: Normal range of motion and neck supple.  Lymphadenopathy:     Cervical: No cervical adenopathy.  Skin:    General: Skin is warm and dry.     Findings: No rash.  Neurological:     Mental Status: She is alert and oriented to person, place, and time.     Deep Tendon Reflexes: Reflexes normal.     Reflex Scores:      Tricep reflexes are 2+ on the right side and 2+ on the left side.      Bicep reflexes are 2+ on the right side and 2+ on the left side.      Brachioradialis reflexes are 2+ on the right side and 2+ on the left side.      Patellar reflexes are 2+ on the right side and 2+ on the left side. Psychiatric:        Speech: Speech normal.        Behavior: Behavior normal.        Thought Content: Thought content normal.    defer skin exam and pap  Assessment/Plan: Health Maintenance Due  Topic Date Due  . TETANUS/TDAP  12/07/2012  . COLONOSCOPY  Never done   Health Maintenance reviewed.  1. Preventative health care She will  Return for Tdap. She is exercising 4 days/week.   2. Screening  for colon cancer - Cologuard  3. Need for Tdap vaccination Return for this  4. Fatigue, unspecified type Start with bloodwork and then consider further eval pending results.  - CBC with Differential/Platelet; Future - TSH; Future - Vitamin B12; Future - VITAMIN D 25 Hydroxy (Vit-D Deficiency, Fractures); Future  5. Lipid screening - Comprehensive metabolic panel; Future - Lipid panel; Future  6. Screening for diabetes mellitus - CBC with Differential/Platelet; Future  Return for return for nurse visit for shingles vaccine and Tdap.  Micheline Rough, MD

## 2020-05-03 NOTE — Addendum Note (Signed)
Addended by: Bonnye Fava on: 05/03/2020 09:19 AM   Modules accepted: Orders

## 2020-05-11 ENCOUNTER — Ambulatory Visit: Payer: No Typology Code available for payment source | Admitting: Podiatry

## 2020-05-20 ENCOUNTER — Other Ambulatory Visit: Payer: Self-pay | Admitting: Podiatry

## 2020-05-20 ENCOUNTER — Other Ambulatory Visit: Payer: Self-pay

## 2020-05-20 ENCOUNTER — Encounter: Payer: Self-pay | Admitting: Podiatry

## 2020-05-20 ENCOUNTER — Ambulatory Visit (INDEPENDENT_AMBULATORY_CARE_PROVIDER_SITE_OTHER): Payer: No Typology Code available for payment source | Admitting: Podiatry

## 2020-05-20 ENCOUNTER — Ambulatory Visit (INDEPENDENT_AMBULATORY_CARE_PROVIDER_SITE_OTHER): Payer: No Typology Code available for payment source

## 2020-05-20 DIAGNOSIS — M9270 Juvenile osteochondrosis of metatarsus, unspecified foot: Secondary | ICD-10-CM

## 2020-05-20 DIAGNOSIS — M79672 Pain in left foot: Secondary | ICD-10-CM | POA: Diagnosis not present

## 2020-05-20 DIAGNOSIS — M79671 Pain in right foot: Secondary | ICD-10-CM

## 2020-05-20 DIAGNOSIS — M9272 Juvenile osteochondrosis of metatarsus, left foot: Secondary | ICD-10-CM

## 2020-05-20 DIAGNOSIS — M779 Enthesopathy, unspecified: Secondary | ICD-10-CM

## 2020-05-28 NOTE — Progress Notes (Signed)
Subjective:   Patient ID: Elizabeth Chung, female   DOB: 54 y.o.   MRN: [id number]   HPI 54 year old female presents the office today for concerns of possible abnormality of the x-ray of the left foot.  She presented x-ray after she dropped a filing cabinet the top of her foot.  X-rays revealed Freiberg's on the second metatarsal.  She is some occasional discomfort of the second MPJ but also to the top of the foot.  She said no recent treatment.  When the following, dropped on top of foot this was March 2021.  Occasional swelling but no redness or warmth.  No other concerns.   Review of Systems  All other systems reviewed and are negative.  Past Medical History:  Diagnosis Date   Anemia     Past Surgical History:  Procedure Laterality Date   REFRACTIVE SURGERY      No current outpatient medications on file.  No Known Allergies       Objective:  Physical Exam  General: AAO x3, NAD  Dermatological: Skin is warm, dry and supple bilateral. Nails x 10 are well manicured; remaining integument appears unremarkable at this time. There are no open sores, no preulcerative lesions, no rash or signs of infection present.  Vascular: Dorsalis Pedis artery and Posterior Tibial artery pedal pulses are 2/4 bilateral with immedate capillary fill time.  There is no pain with calf compression, swelling, warmth, erythema.   Neruologic: Grossly intact via light touch bilateral.   Musculoskeletal: Minimal tenderness on the dorsal aspect the midfoot but no specific area of tenderness.  Flexor, extensor tendons appear to be intact.  Minimal swelling to the second MPJ with no erythema or warmth.  No pain or crepitation with MPJ range of motion.  No other areas of discomfort identified today.  Muscular strength 5/5 in all groups tested bilateral.  Gait: Unassisted, Nonantalgic.       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  I independently reviewed the previous x-rays also obtained new x-rays today.  Does not appear to be significantly changed.  There is no significant collapse of the metatarsal head at this time.  However given concern for Freiberg's morning recommended mobilization and a surgical shoe which is dependent today.  Discussed with her custom orthotics in order to help avoid pressure to the second MPJ.  We will hold anti-inflammatories or steroids.  I will see back in 4 to 6 weeks and repeat x-ray left foot or sooner if any issues arise.  We will possibly measure for orthotics next appointment as well.  No follow-ups on file.

## 2020-05-29 ENCOUNTER — Other Ambulatory Visit: Payer: Self-pay | Admitting: Podiatry

## 2020-05-29 DIAGNOSIS — M9270 Juvenile osteochondrosis of metatarsus, unspecified foot: Secondary | ICD-10-CM

## 2020-07-01 ENCOUNTER — Ambulatory Visit (INDEPENDENT_AMBULATORY_CARE_PROVIDER_SITE_OTHER): Payer: BC Managed Care – PPO

## 2020-07-01 ENCOUNTER — Other Ambulatory Visit: Payer: Self-pay

## 2020-07-01 ENCOUNTER — Ambulatory Visit (INDEPENDENT_AMBULATORY_CARE_PROVIDER_SITE_OTHER): Payer: BC Managed Care – PPO | Admitting: Podiatry

## 2020-07-01 DIAGNOSIS — M9272 Juvenile osteochondrosis of metatarsus, left foot: Secondary | ICD-10-CM | POA: Diagnosis not present

## 2020-07-01 DIAGNOSIS — M779 Enthesopathy, unspecified: Secondary | ICD-10-CM | POA: Diagnosis not present

## 2020-07-02 NOTE — Progress Notes (Signed)
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  She states that the surgical correction makes her foot hurt more than it previously.  No increase in swelling or redness. Denies any systemic complaints such as fevers, chills, nausea, vomiting. No acute changes since last appointment, and no other complaints at this time.   Objective: AAO x3, NAD DP/PT pulses palpable bilaterally, CRT less than 3 seconds Minimal tenderness of the second MPJ there is transverse plane deformity with medial deviation of the second toe which is been chronic.  Trace edema there is no erythema or warmth.  No pain with imaging range of motion.  There is no other areas of discomfort identified at this time. No pain with calf compression, swelling, warmth, erythema  Assessment: Left foot Freiberg's  Plan: -All treatment options discussed with the patient including all alternatives, risks, complications.  -Repeat x-rays obtained reviewed.  In the oblique view there is mild flattening of the second metatarsal head with no collapse otherwise. -Surgical she has been hurting so return to regular shoe I dispensed offloading pads.  Also I had her follow-up with [facility] today for multiorthotics to offload second MPJ. -Patient encouraged to call the office with any questions, concerns, change in symptoms.   Vivi Barrack DPM

## 2020-07-18 ENCOUNTER — Encounter: Payer: Self-pay | Admitting: Podiatry

## 2020-07-18 ENCOUNTER — Other Ambulatory Visit: Payer: Self-pay

## 2020-07-18 ENCOUNTER — Ambulatory Visit: Payer: BC Managed Care – PPO | Admitting: Orthotics

## 2020-07-18 DIAGNOSIS — M9272 Juvenile osteochondrosis of metatarsus, left foot: Secondary | ICD-10-CM

## 2020-07-18 DIAGNOSIS — M779 Enthesopathy, unspecified: Secondary | ICD-10-CM

## 2020-07-18 NOTE — Progress Notes (Signed)
Patient came in today to pick up custom made foot orthotics.  The goals were accomplished and the patient reported no dissatisfaction with said orthotics.  Patient was advised of breakin period and how to report any issues. 

## 2020-07-30 ENCOUNTER — Ambulatory Visit (INDEPENDENT_AMBULATORY_CARE_PROVIDER_SITE_OTHER): Payer: BC Managed Care – PPO

## 2020-07-30 ENCOUNTER — Ambulatory Visit (INDEPENDENT_AMBULATORY_CARE_PROVIDER_SITE_OTHER): Payer: BC Managed Care – PPO | Admitting: Podiatry

## 2020-07-30 ENCOUNTER — Other Ambulatory Visit: Payer: Self-pay

## 2020-07-30 DIAGNOSIS — M9272 Juvenile osteochondrosis of metatarsus, left foot: Secondary | ICD-10-CM

## 2020-07-30 DIAGNOSIS — M9271 Juvenile osteochondrosis of metatarsus, right foot: Secondary | ICD-10-CM

## 2020-07-30 DIAGNOSIS — M9270 Juvenile osteochondrosis of metatarsus, unspecified foot: Secondary | ICD-10-CM

## 2020-07-30 DIAGNOSIS — M779 Enthesopathy, unspecified: Secondary | ICD-10-CM

## 2020-08-06 NOTE — Progress Notes (Signed)
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 She has picked up the orthotics and she feels that she made some progress. She has multiple questions in regards to activity level and progression of Freiberg's. Originally when she discussed with her doctor there was a "fluid pocket" on the top of her foot which is dissipated on x-rays where the Freiberg's was noted. Denies any systemic complaints such as fevers, chills, nausea, vomiting. No acute changes since last appointment, and no other complaints at this time.   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 Minimal edema there is no erythema or warmth.  No pain with imaging range of motion.  There is no other areas of discomfort identified at this time. There is equal temperature and color gradient bilaterally. No pain with calf compression, swelling, warmth, erythema  Assessment: Left foot Freiberg's  Plan: -All treatment options discussed with the patient including all alternatives, risks, complications.  -Repeat x-rays obtained reviewed. X-rays obtained bilaterally for comparison. Appears that the contour of the second tarsal is equal bilaterally. 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 She has not been putting full weight on her foot and she was in surgical shoe for quite some time. 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 Discussed with her trying to walk on a more normal gait pattern. We discussed exercises she can do at home and we discussed holding off any high impact activities for now. Range of motion, rehab exercises also discussed.  Vivi Barrack  DPM

## 2020-09-03 ENCOUNTER — Ambulatory Visit: Payer: BC Managed Care – PPO | Admitting: Podiatry

## 2021-08-10 IMAGING — MG DIGITAL SCREENING BILAT W/ CAD
5 series · 5 of 5 positions shown · non-contrast
Comparison: None.

CLINICAL DATA: Screening.

EXAM:
DIGITAL SCREENING BILATERAL MAMMOGRAM WITH CAD

[L CC]
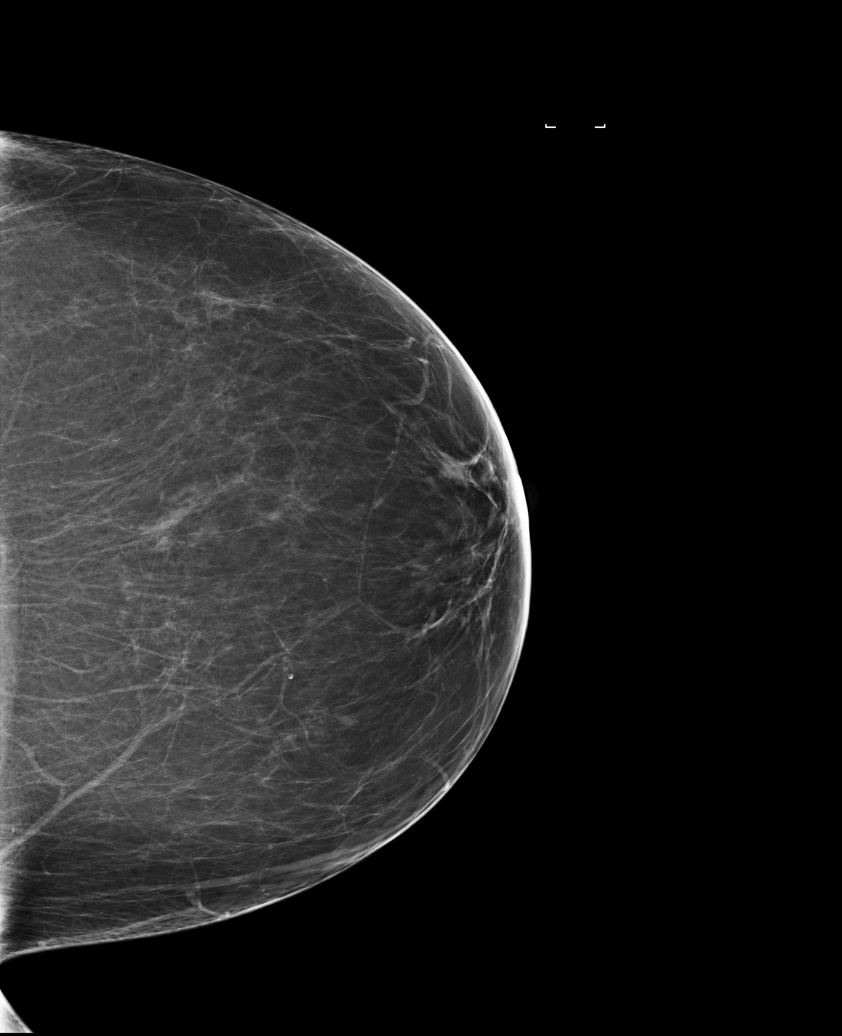

[L MLO (1 of 2)]
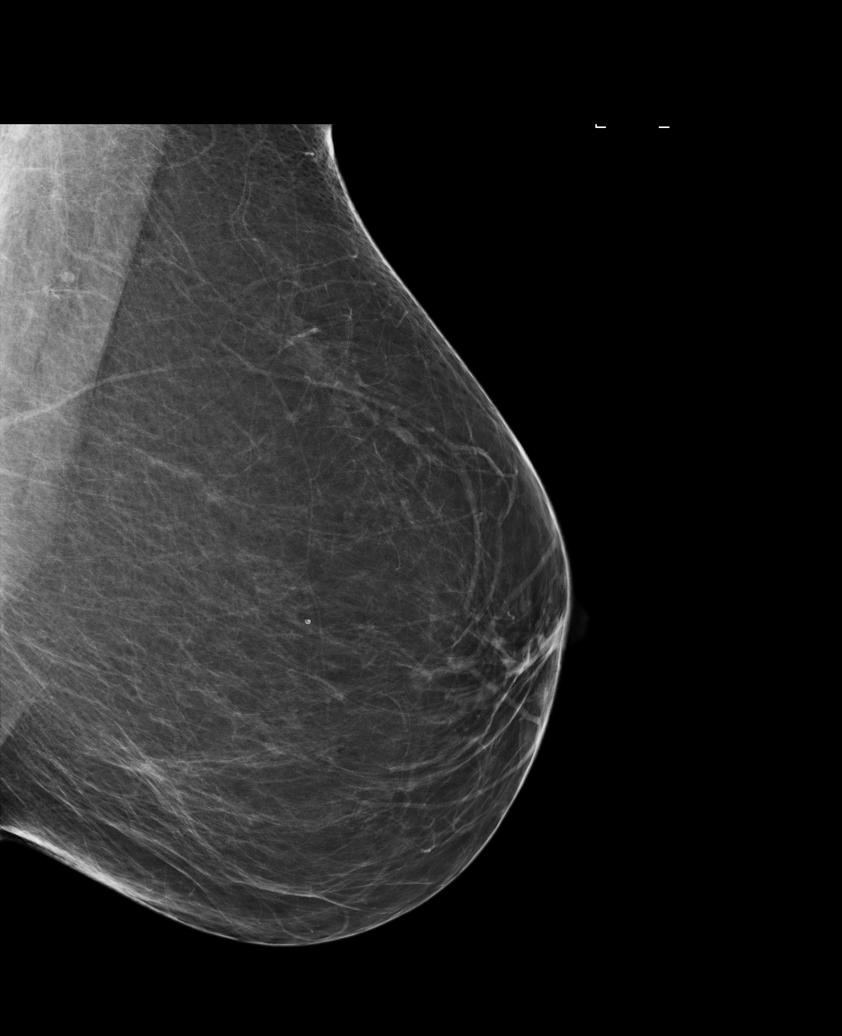

[R CC]
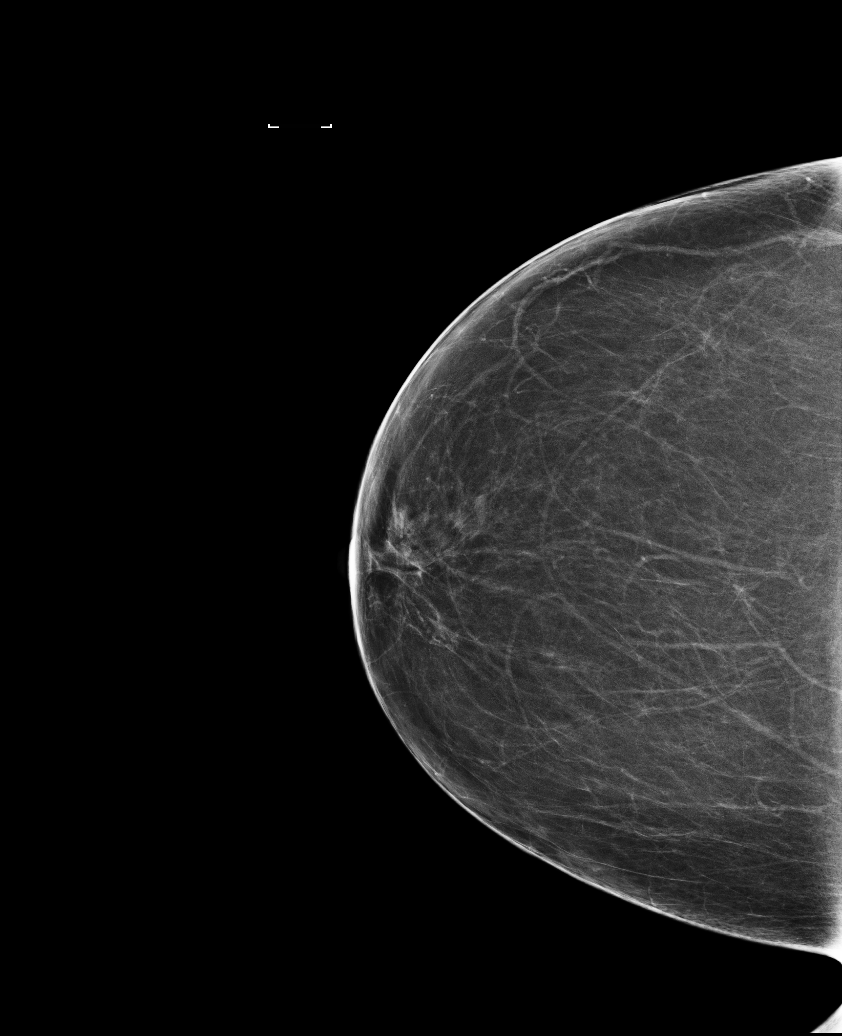

[R MLO]
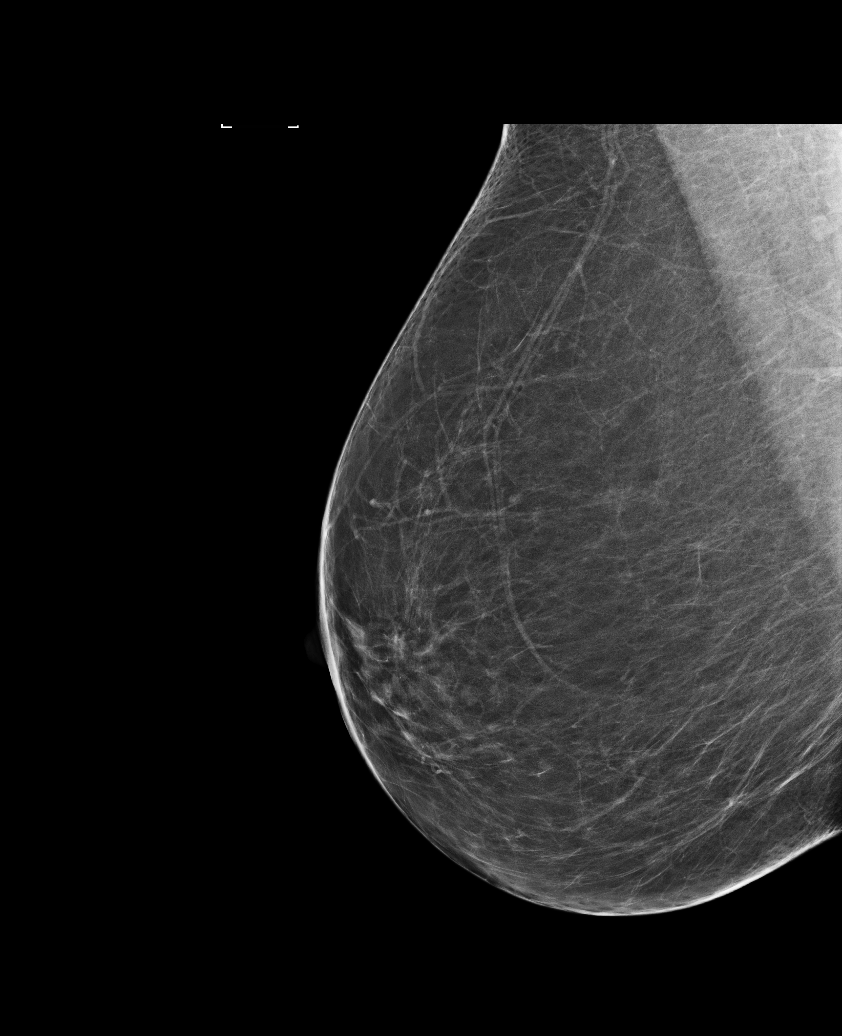

[L MLO (2 of 2)]
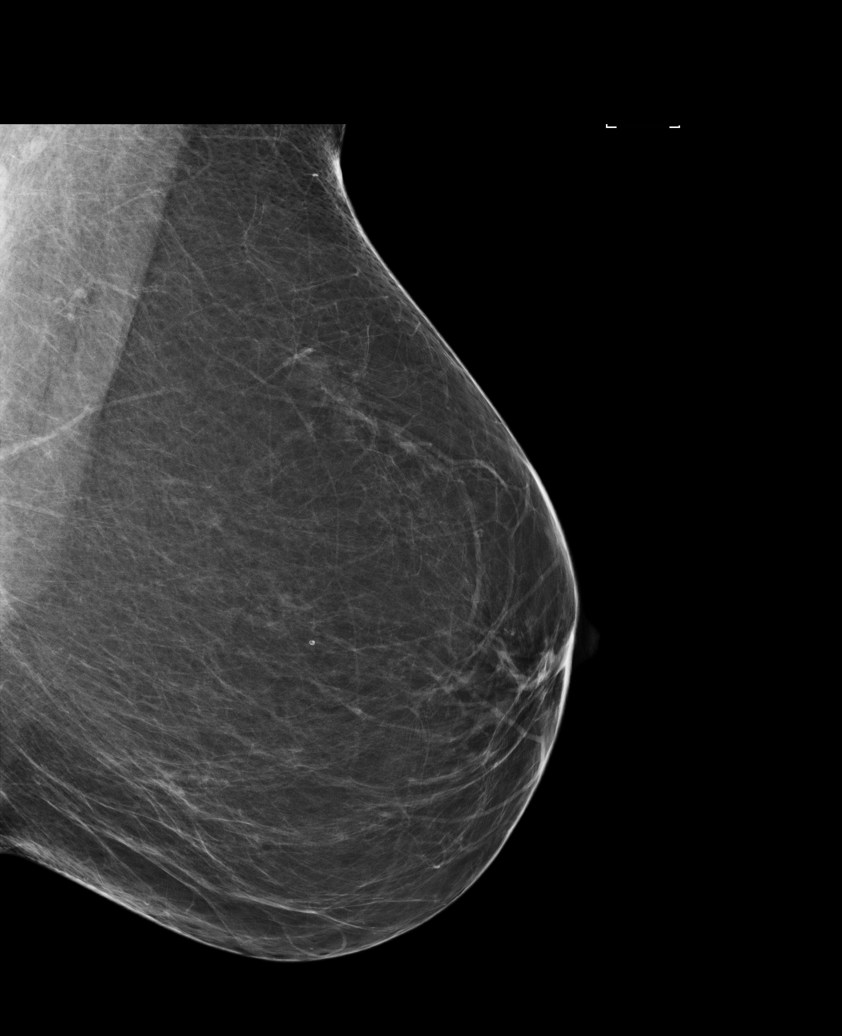

[5 of 5 positions shown; findings below may reference images not displayed]

ACR Breast Density Category b: There are scattered areas of
fibroglandular density.
FINDINGS: There are no findings suspicious for malignancy. Images were
processed with CAD.
IMPRESSION: No mammographic evidence of malignancy. A result letter of this
screening mammogram will be mailed directly to the patient.

RECOMMENDATION:
Screening mammogram in one year. (Code:[id number])

BI-RADS CATEGORY  1: Negative.

## 2022-03-06 IMAGING — DX DG FOOT COMPLETE 3+V*L*
3 series · 3 of 3 positions shown · non-contrast
Comparison: None.

CLINICAL DATA: Foot pain in the metatarsal region after injury 2
months ago

EXAM:
LEFT FOOT - COMPLETE 3+ VIEW

[foot ap]
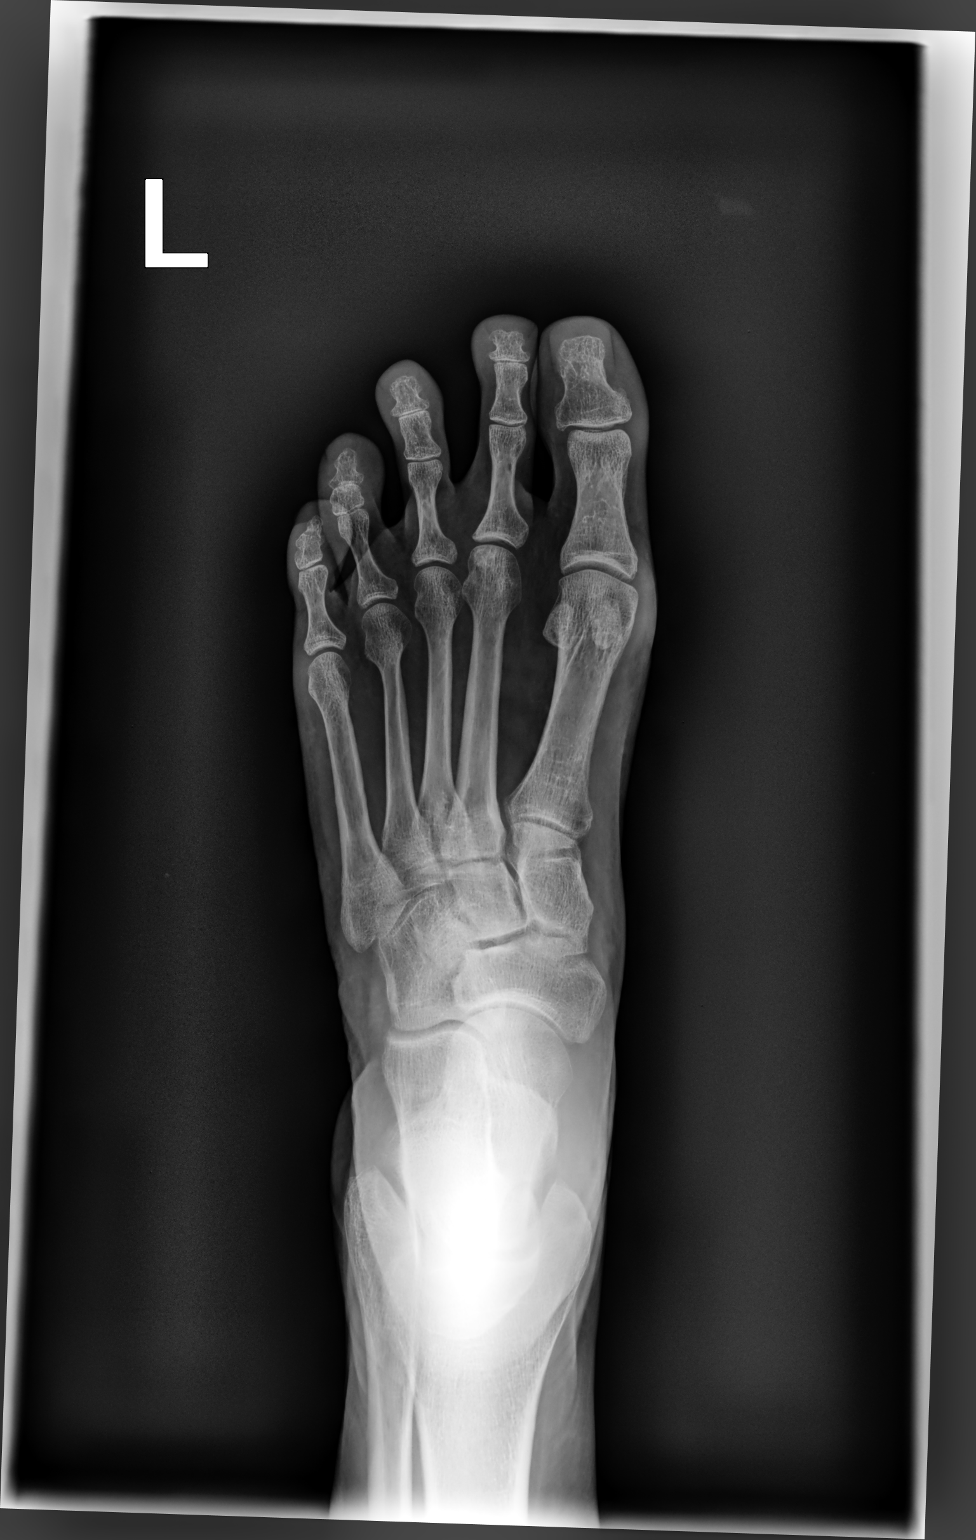

[foot mlo]
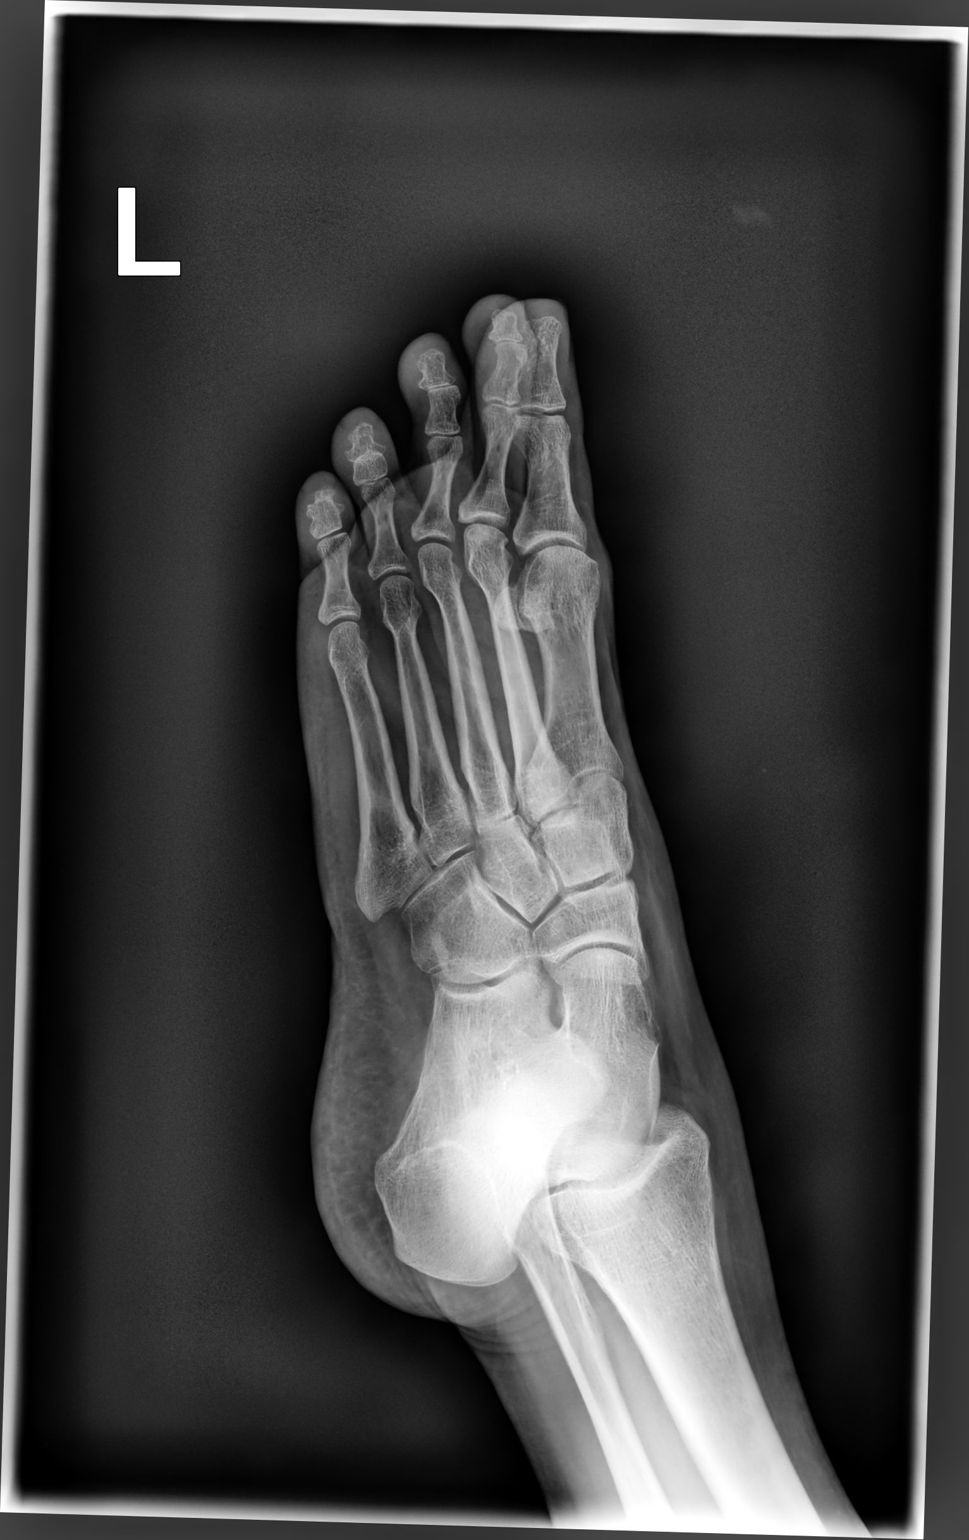

[foot lat]
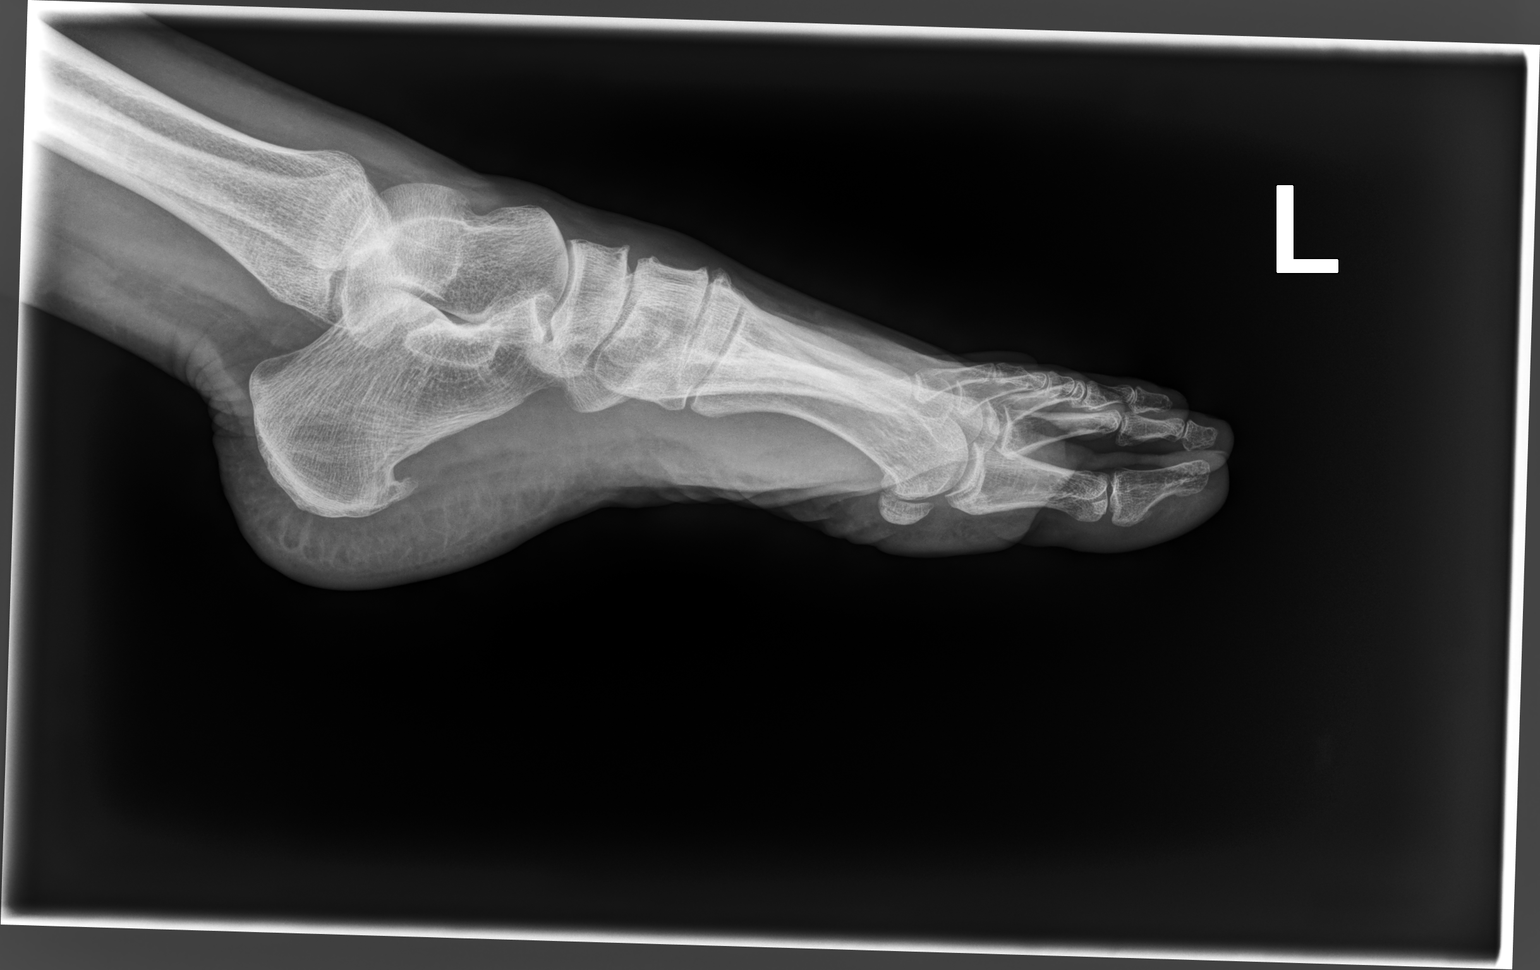

[3 of 3 positions shown; findings below may reference images not displayed]

FINDINGS: There is no evidence of fracture or dislocation. Subtle sclerosis
within the second metatarsal head with preservation of the second
MTP joint space. Mild arthropathy within the midfoot with dorsal
proliferative change. Small plantar calcaneal spur. Soft tissues are
unremarkable.
IMPRESSION: 1. Subtle sclerosis within the second metatarsal head with
preservation of the second MTP joint space raising the suspicion for
osteonecrosis (Japharov infraction).
2. Otherwise, no acute osseous abnormality, left foot.
